# Patient Record
Sex: Female | Born: 1960 | Race: White | Hispanic: No | Marital: Married | Smoking: Never smoker
Health system: Southern US, Community
[De-identification: ages and names within clinical notes are randomized; demographics above are authoritative.]

## PROBLEM LIST (undated history)

## (undated) DIAGNOSIS — K635 Polyp of colon: Secondary | ICD-10-CM

## (undated) DIAGNOSIS — F419 Anxiety disorder, unspecified: Secondary | ICD-10-CM

## (undated) DIAGNOSIS — R51 Headache: Secondary | ICD-10-CM

## (undated) DIAGNOSIS — N390 Urinary tract infection, site not specified: Secondary | ICD-10-CM

## (undated) DIAGNOSIS — IMO0002 Reserved for concepts with insufficient information to code with codable children: Secondary | ICD-10-CM

## (undated) HISTORY — DX: Polyp of colon: K63.5

## (undated) HISTORY — PX: OOPHORECTOMY: SHX86

## (undated) HISTORY — DX: Anxiety disorder, unspecified: F41.9

## (undated) HISTORY — PX: ABDOMINAL HYSTERECTOMY: SHX81

## (undated) HISTORY — DX: Reserved for concepts with insufficient information to code with codable children: IMO0002

## (undated) HISTORY — DX: Headache: R51

## (undated) HISTORY — DX: Urinary tract infection, site not specified: N39.0

---

## 1988-11-29 DIAGNOSIS — IMO0002 Reserved for concepts with insufficient information to code with codable children: Secondary | ICD-10-CM

## 1988-11-29 HISTORY — DX: Reserved for concepts with insufficient information to code with codable children: IMO0002

## 2005-11-29 HISTORY — PX: NASAL SINUS SURGERY: SHX719

## 2005-12-05 LAB — HM MAMMOGRAPHY: HM Mammogram: NORMAL

## 2008-02-13 ENCOUNTER — Encounter: Admission: RE | Admit: 2008-02-13 | Discharge: 2008-02-13 | Payer: Self-pay | Admitting: Family Medicine

## 2008-12-12 ENCOUNTER — Encounter: Admission: RE | Admit: 2008-12-12 | Discharge: 2008-12-12 | Payer: Self-pay | Admitting: Otolaryngology

## 2009-04-21 IMAGING — CT CT ABDOMEN W/O CM
2 of 4 series · 17 of 46 positions shown, 19 images · IV contrast (READICAT/WATER &)
Comparison: none

CLINICAL DATA: Right lower quadrant abdominal pain.  Some nausea.  
 ABDOMEN CT WITHOUT CONTRAST:
TECHNIQUE: Multidetector CT imaging of the abdomen was performed following the standard protocol without IV contrast.  No IV contrast media was given due to a history of prior IV contrast allergy.
TECHNIQUE: Multidetector CT imaging of the pelvis was performed following the standard protocol without IV contrast.

[Series 2: a&p w/o · axial · non-contrast · 0.62mm/px · z∈[-380,-20]mm · 14 of 80 slices shown, 16 images]
[im 4/80  soft-tissue]
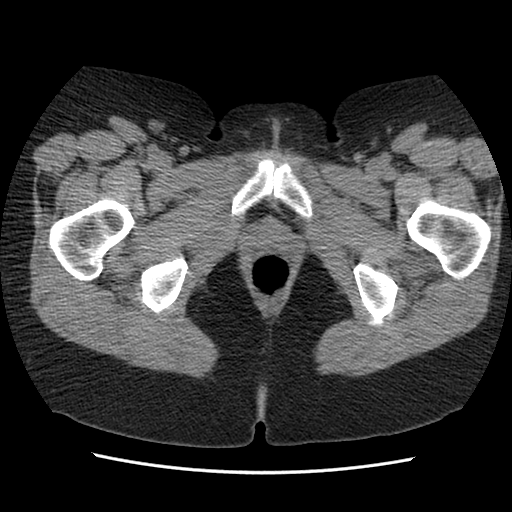
[im 4/80  bone]
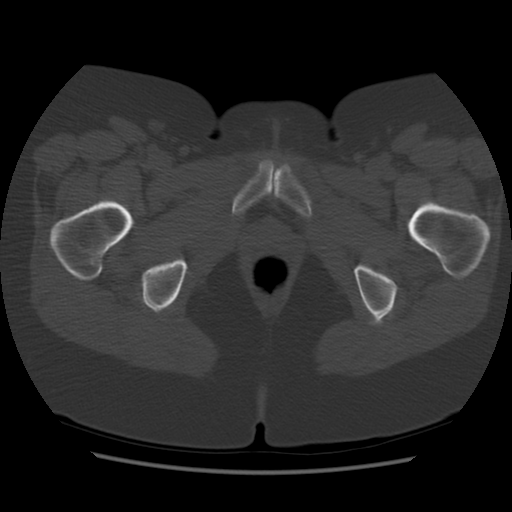
[im 10/80  soft-tissue]
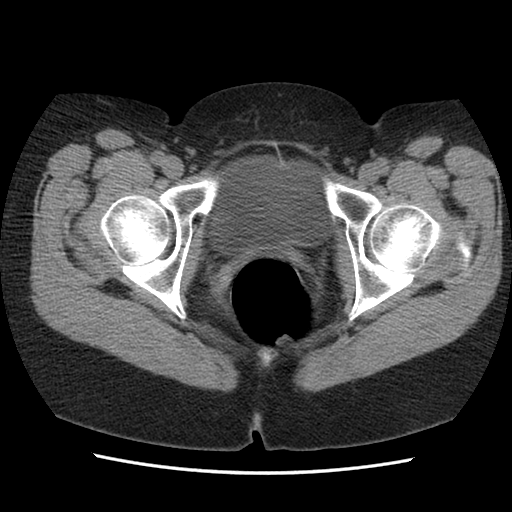
[im 17/80  soft-tissue]
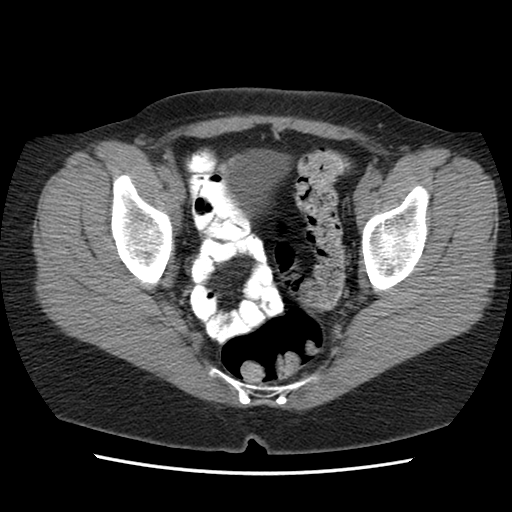
[im 20/80  soft-tissue]
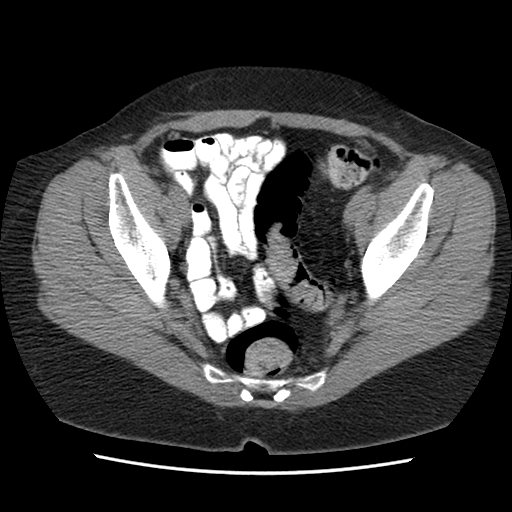
[im 27/80  soft-tissue]
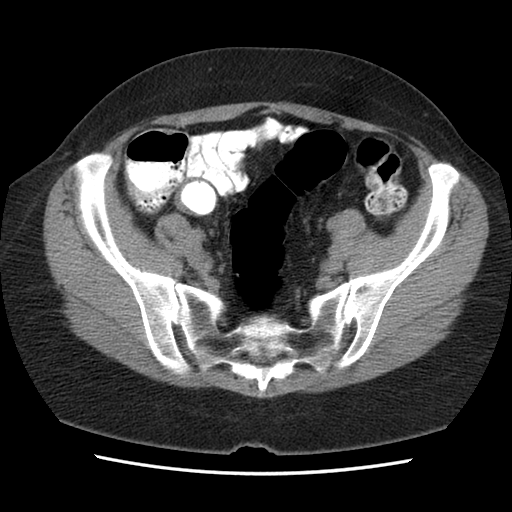
[im 33/80  soft-tissue]
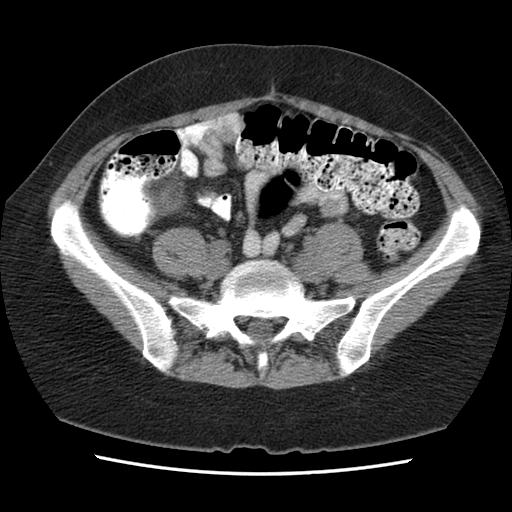
[im 37/80  soft-tissue]
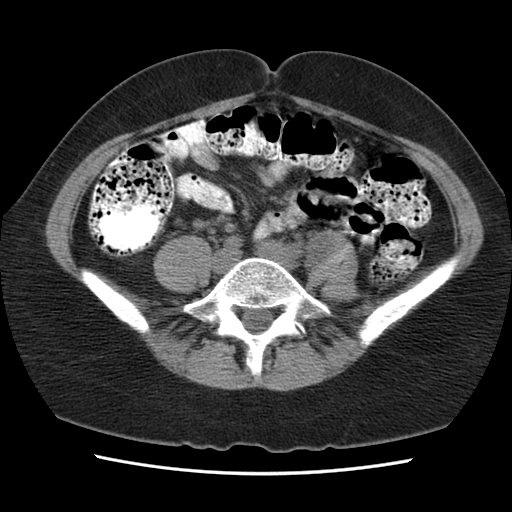
[im 43/80  soft-tissue]
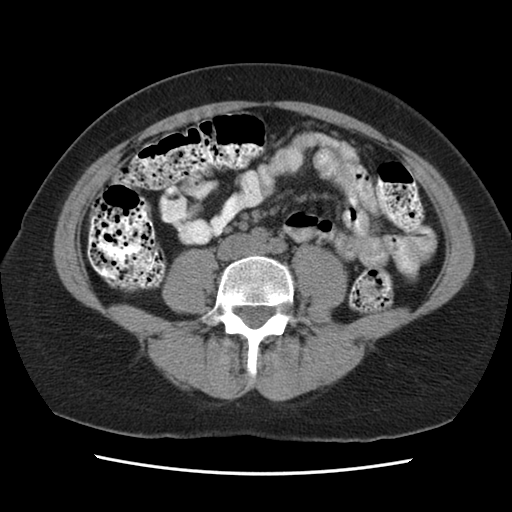
[im 47/80  soft-tissue]
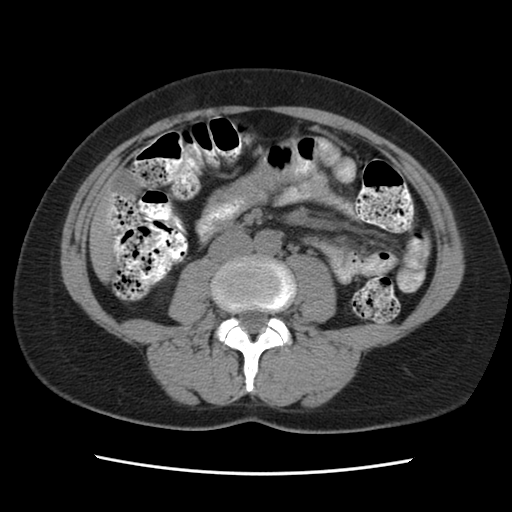
[im 47/80  bone]
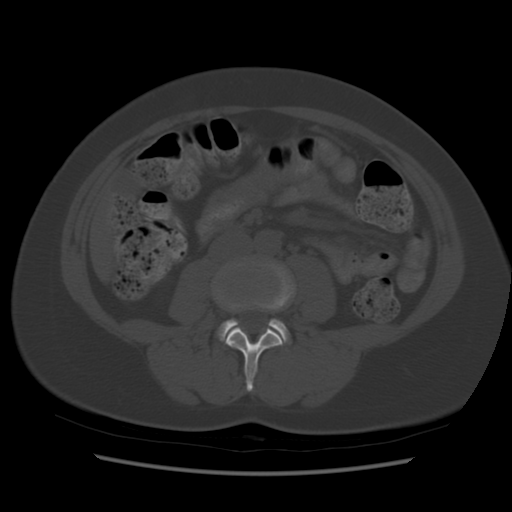
[im 53/80  soft-tissue]
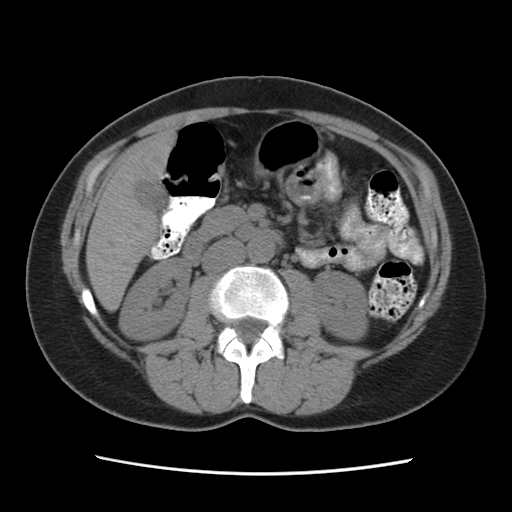
[im 60/80  soft-tissue]
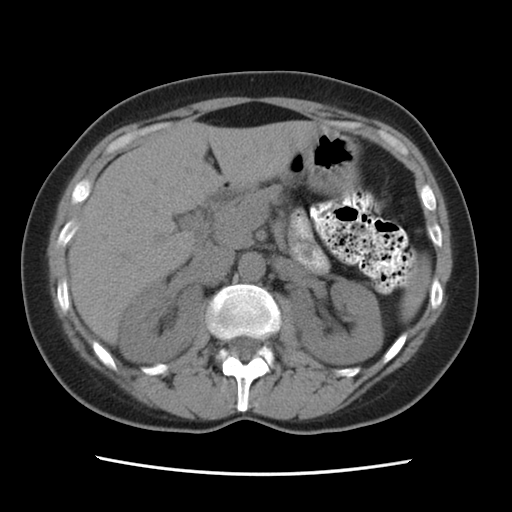
[im 63/80  soft-tissue]
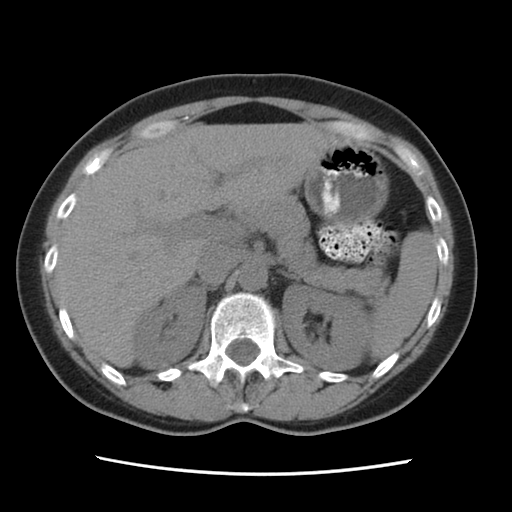
[im 70/80  soft-tissue]
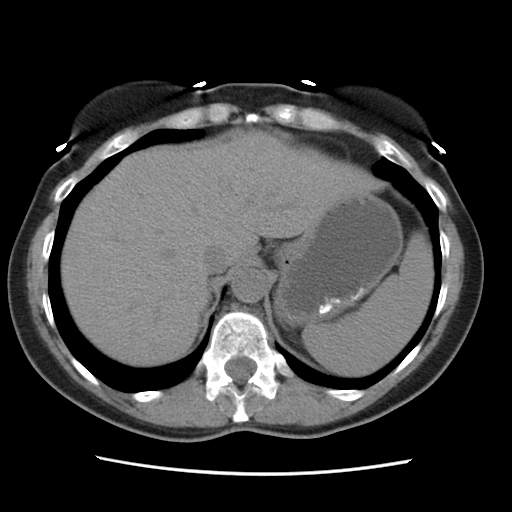
[im 76/80  soft-tissue]
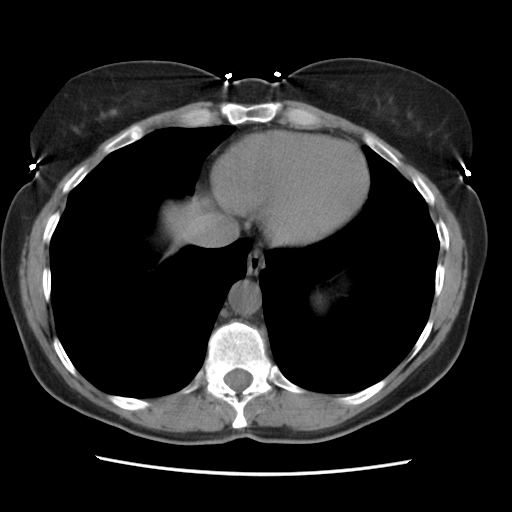

[Series 400: cor abd · coronal · 0.82mm/px · 3 of 81 slices shown]
[im 27/81  soft-tissue]
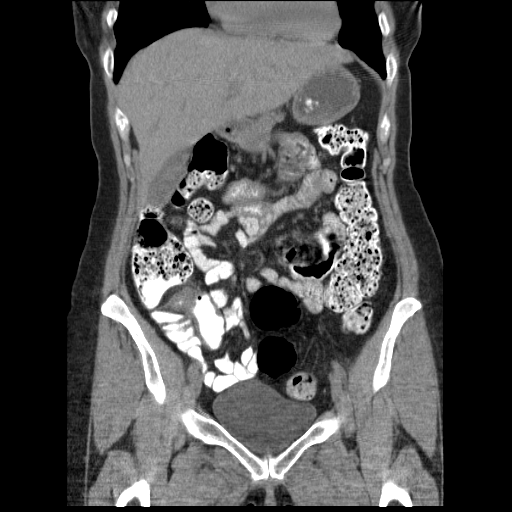
[im 36/81  soft-tissue]
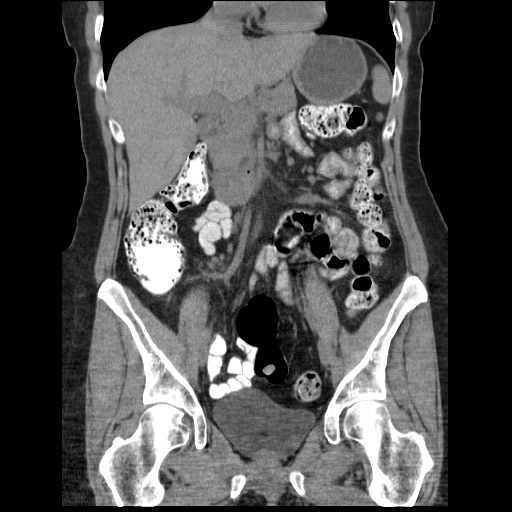
[im 45/81  soft-tissue]
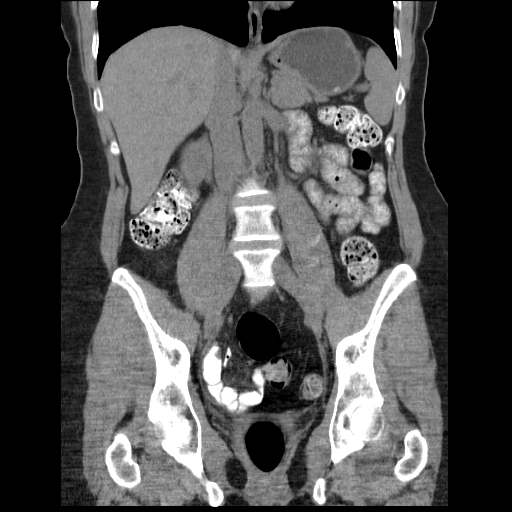

[17 of 46 positions shown; findings below may reference images not displayed]

FINDINGS: The lung bases are clear.  The liver appears normal in the unenhanced state.  No calcified gallstones are seen.  The pancreas is grossly normal on this unenhanced exam.  The adrenal glands and spleen appear normal.  Scanning through the kidneys no renal calculi are seen and there is no evidence of hydronephrosis.  The abdominal aorta is normal in caliber.
IMPRESSION: Negative CT of the abdomen.  No renal calculi and no hydronephrosis. 
 PELVIS CT WITHOUT CONTRAST:
FINDINGS: The distal ureters are normal in caliber.  However, there is thickening of the mucosa of the distal and terminal ileum.  This is suspicious for inflammatory bowel disease.  No abscess is seen.  The urinary bladder is unremarkable.  No fluid is noted within the pelvis and no adenopathy is seen.
IMPRESSION: Mucosal edema of the distal and terminal ileum suspicious for inflammatory bowel disease such as Crohn's.  No abscess.

## 2009-08-22 ENCOUNTER — Ambulatory Visit (HOSPITAL_COMMUNITY): Admission: RE | Admit: 2009-08-22 | Discharge: 2009-08-22 | Payer: Self-pay | Admitting: Urology

## 2010-12-20 ENCOUNTER — Encounter: Payer: Self-pay | Admitting: Family Medicine

## 2011-09-08 ENCOUNTER — Encounter: Payer: Self-pay | Admitting: Family Medicine

## 2011-09-08 ENCOUNTER — Ambulatory Visit (INDEPENDENT_AMBULATORY_CARE_PROVIDER_SITE_OTHER): Payer: Managed Care, Other (non HMO) | Admitting: Family Medicine

## 2011-09-08 VITALS — BP 140/92 | HR 80 | Temp 98.8°F | Resp 12 | Ht 63.75 in | Wt 155.0 lb

## 2011-09-08 DIAGNOSIS — D126 Benign neoplasm of colon, unspecified: Secondary | ICD-10-CM

## 2011-09-08 DIAGNOSIS — Z8659 Personal history of other mental and behavioral disorders: Secondary | ICD-10-CM | POA: Insufficient documentation

## 2011-09-08 DIAGNOSIS — K589 Irritable bowel syndrome without diarrhea: Secondary | ICD-10-CM | POA: Insufficient documentation

## 2011-09-08 DIAGNOSIS — R002 Palpitations: Secondary | ICD-10-CM

## 2011-09-08 DIAGNOSIS — K635 Polyp of colon: Secondary | ICD-10-CM | POA: Insufficient documentation

## 2011-09-08 DIAGNOSIS — G43909 Migraine, unspecified, not intractable, without status migrainosus: Secondary | ICD-10-CM | POA: Insufficient documentation

## 2011-09-08 LAB — BASIC METABOLIC PANEL
Calcium: 8.8 mg/dL (ref 8.4–10.5)
GFR: 103.67 mL/min (ref >60.00)
Glucose, Bld: 117 mg/dL — ABNORMAL HIGH (ref 70–99)
Sodium: 141 mEq/L (ref 135–145)

## 2011-09-08 NOTE — Patient Instructions (Signed)
Continue to minimize caffeine. Consider low dose beta blocker if symptoms persist.

## 2011-09-08 NOTE — Progress Notes (Signed)
  Subjective:    Patient ID: Debra Mcgrath, female    DOB: 09/27/1961, 50 y.o.   MRN: [id number]  HPI New patient evaluation. Patient has history of childhood asthma, remote history depression, history of migraine headaches, colon polyps and recurrent UTIs. Previous hysterectomy for benign disease. She gives history of some type of possible ectopic thyroid tissue on one of her ovaries several years ago. At that time presented with palpitations and tachycardia.  She relates two-week history of some frequent palpitations and sensation of heart racing. Pulse not taken. Minimal caffeine use. No decongestant use. Currently takes only Keflex post coital to reduce UTI risk. Occasional dizziness. No syncope. Denies chest pains or dyspnea. Weight stable. No recent diarrhea. No tremors. Denies specific stressors.  Family history and social history reviewed as below.  Past Medical History  Diagnosis Date  . Asthma   . Depression   . Headache   . Migraine   . Colon polyps   . UTI (urinary tract infection)   . 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 Nasal sinus surgery 2007  . Pelvic floor repair     reports that she has never smoked. She does not have any smokeless tobacco history on file. Her alcohol and drug histories not on file. family history includes Cancer in her father and paternal aunt; Hypertension in her maternal grandfather; and Stroke in her maternal grandfather. No Known Allergies    Review of Systems  Constitutional: Negative for fever, appetite change, fatigue and unexpected weight change.  Respiratory: Negative for cough, shortness of breath and wheezing.   Cardiovascular: Positive for palpitations. Negative for chest pain and leg swelling.  Gastrointestinal: Negative for nausea, vomiting, abdominal pain and diarrhea.  Genitourinary: Negative for dysuria.  Musculoskeletal: Negative for back pain.  Skin:  Negative for rash.  Neurological: Positive for dizziness. Negative for syncope and weakness.  Psychiatric/Behavioral: Negative for dysphoric mood and agitation.       Objective:   Physical Exam  Constitutional: She is oriented to person, place, and time. She appears well-developed and well-nourished.  HENT:  Right Ear: External ear normal.  Left Ear: External ear normal.  Mouth/Throat: Oropharynx is clear and moist.  Neck: Neck supple. No thyromegaly present.  Cardiovascular: Normal rate, regular rhythm and normal heart sounds.   No murmur heard. Pulmonary/Chest: Effort normal and breath sounds normal. No respiratory distress. She has no wheezes. She has no rales.  Musculoskeletal: She exhibits no edema.  Lymphadenopathy:    She has no cervical adenopathy.  Neurological: She is alert and oriented to person, place, and time.  Psychiatric: She has a normal mood and affect. Her behavior is normal.          Assessment & Plan:  #1 palpitations. Normal rate and rhythm at this time. Baseline EKG. Check thyroid functions. Previous Holter monitor has been unremarkable. Consider possible repeat Holter monitoring verses low dose beta blocker pending labs. #2 History of IBS #3 History of depression stable. #4 hx of migraine headaches.  EKG shows sinus rhythm with no acute findings. Heart rate around 70.

## 2011-09-09 NOTE — Progress Notes (Signed)
Quick Note:  Pt informed on home VM, will mail a copy to home ______

## 2011-09-21 ENCOUNTER — Ambulatory Visit (INDEPENDENT_AMBULATORY_CARE_PROVIDER_SITE_OTHER): Payer: Managed Care, Other (non HMO) | Admitting: Family Medicine

## 2011-09-21 ENCOUNTER — Encounter: Payer: Self-pay | Admitting: Family Medicine

## 2011-09-21 VITALS — BP 140/90 | Temp 97.6°F | Wt 151.0 lb

## 2011-09-21 DIAGNOSIS — R109 Unspecified abdominal pain: Secondary | ICD-10-CM

## 2011-09-21 DIAGNOSIS — R1030 Lower abdominal pain, unspecified: Secondary | ICD-10-CM

## 2011-09-21 DIAGNOSIS — N949 Unspecified condition associated with female genital organs and menstrual cycle: Secondary | ICD-10-CM

## 2011-09-21 DIAGNOSIS — IMO0002 Reserved for concepts with insufficient information to code with codable children: Secondary | ICD-10-CM

## 2011-09-21 DIAGNOSIS — R102 Pelvic and perineal pain: Secondary | ICD-10-CM

## 2011-09-21 LAB — CBC WITH DIFFERENTIAL/PLATELET
Basophils Absolute: 0 10*3/uL (ref 0.0–0.1)
Basophils Relative: 0.4 % (ref 0.0–3.0)
Eosinophils Absolute: 0 10*3/uL (ref 0.0–0.7)
Lymphocytes Relative: 30 % (ref 12.0–46.0)
MCHC: 33.7 g/dL (ref 30.0–36.0)
Neutrophils Relative %: 63 % (ref 43.0–77.0)
RBC: 4.55 Mil/uL (ref 3.87–5.11)
RDW: 12.9 % (ref 11.5–14.6)

## 2011-09-21 LAB — SEDIMENTATION RATE: Sed Rate: 23 mm/hr — ABNORMAL HIGH (ref 0–22)

## 2011-09-21 NOTE — Patient Instructions (Signed)
We will call you regarding GYN appointment.

## 2011-09-21 NOTE — Progress Notes (Signed)
  Subjective:    Patient ID: Debra Mcgrath, female    DOB: Dec 16, 1960, 50 y.o.   MRN: [id number]  HPI  Patient seen with several weeks if not months of poorly localized pain in the pelvic region. She complains of sharp pain which is relatively constant right pelvic region and also some pain intra-vaginal. Previous total abdominal hysterectomy several years ago. She has noted pain with intercourse which seems to be along the posterior wall of the vagina. She's had recent symptoms of nausea and fatigue but reports weight gain- although she is down 4 pounds from last visit.  She has seen urologist previously for hematuria workup which was unrevealing. She reportedly had some type of cyst on her kidney. She just saw urologist couple weeks ago reportedly urinalysis unremarkable then. Patient relates no change in bowel movements. She has questionable history of IBS but no recent constipation or diarrhea. Colonoscopy 2006 reportedly showed benign polyps. Questionable family history of Crohn's disease in her son. Patient had CT abdomen and pelvis 2009 with comment of questionable inflammation ileum consistent with Crohn's. She does not recall any information regarding that.  Patient has questionable history of endometriosis.  Not sure how this was confirmed.  Past Medical History  Diagnosis Date  . Asthma   . Depression   . Headache   . Migraine   . Colon polyps   . UTI (urinary tract infection)   . 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 Nasal sinus surgery 2007  . Pelvic floor repair     reports that she has never smoked. She does not have any smokeless tobacco history on file. Her alcohol and drug histories not on file. family history includes Cancer in her father and paternal aunt; Hypertension in her maternal grandfather; and Stroke in her maternal grandfather. No Known Allergies   Review of Systems  Constitutional:  Positive for appetite change. Negative for fever and chills.  Respiratory: Negative for cough and shortness of breath.   Cardiovascular: Negative for chest pain.  Gastrointestinal: Positive for nausea and abdominal pain. Negative for vomiting, diarrhea, constipation, blood in stool and abdominal distention.  Genitourinary: Positive for vaginal pain and pelvic pain. Negative for dysuria, vaginal bleeding and vaginal discharge.  Hematological: Negative for adenopathy.       Objective:   Physical Exam  Constitutional: She appears well-developed and well-nourished.  Cardiovascular: Normal rate, regular rhythm and normal heart sounds.   Pulmonary/Chest: Effort normal and breath sounds normal. No respiratory distress. She has no wheezes. She has no rales.  Abdominal: Soft. Bowel sounds are normal. She exhibits no distension and no mass. There is no rebound and no guarding.       Mildly tender to palpation right and left lower quadrants  Genitourinary:       Normal external genitalia. Patient has absence of cervix and uterus. She has scar tissue vaginal cuff from previous hysterectomy. Along the right posterior wall of the vagina she has very inflamed erythematous tissue. No distinct mass. No vaginal discharge.          Assessment & Plan:  Pelvic pain and lower abdominal pain. Question etiology. Check CBC and sed rate. GYN referral given abnormal exam above-she has inflamed appearing tissue vaginal cuff region R side > L of uncertain significance.  No vaginal discharge.. Consider GI referral if pain persist for consideration of repeat colonoscopy

## 2011-09-22 ENCOUNTER — Telehealth: Payer: Self-pay | Admitting: Family Medicine

## 2011-09-22 NOTE — Progress Notes (Signed)
Quick Note:  Pt informed ______ 

## 2011-09-22 NOTE — Telephone Encounter (Signed)
Please advise 

## 2011-09-22 NOTE — Telephone Encounter (Signed)
[name], your advise please

## 2011-09-22 NOTE — Telephone Encounter (Signed)
Pt called and said that the gynecologist, Dr Marcelle Overlie, next avail appt is not until 10/12/11. Pt says that she can not wait that long and is req another recommendation for gynecologist.

## 2011-09-22 NOTE — Telephone Encounter (Signed)
Let's see if someone else in their group can see her sooner.

## 2011-10-06 LAB — HM PAP SMEAR: HM Pap smear: ABNORMAL

## 2011-11-15 ENCOUNTER — Encounter: Payer: Self-pay | Admitting: Family Medicine

## 2011-11-15 ENCOUNTER — Ambulatory Visit (INDEPENDENT_AMBULATORY_CARE_PROVIDER_SITE_OTHER): Payer: Managed Care, Other (non HMO) | Admitting: Family Medicine

## 2011-11-15 VITALS — BP 138/82 | Temp 98.4°F | Wt 150.0 lb

## 2011-11-15 DIAGNOSIS — J029 Acute pharyngitis, unspecified: Secondary | ICD-10-CM

## 2011-11-15 DIAGNOSIS — J329 Chronic sinusitis, unspecified: Secondary | ICD-10-CM

## 2011-11-15 MED ORDER — AMOXICILLIN-POT CLAVULANATE 875-125 MG PO TABS: 1.0000 | ORAL_TABLET | Freq: Two times a day (BID) | ORAL | Status: AC

## 2011-11-15 NOTE — Progress Notes (Signed)
  Subjective:    Patient ID: Debra Mcgrath, female    DOB: Jun 14, 1961, 50 y.o.   MRN: [id number]  HPI  Patient seen with acute illness. She's had about 3 weeks of some intermittent facial fullness and sinus congestion maxillary region bilaterally. Past few days had some sore throat and body aches. No confirmed fever. Sensation of fullness in both ears. Very transient vertigo. No hearing loss. Both son and daughter recently diagnosed with group A strep. Patient has prior history of sinus surgery 2007. Nonsmoker. Minimal cough, nonproductive.   Review of Systems  Constitutional: Positive for fatigue. Negative for fever and chills.  HENT: Positive for congestion, sore throat and sinus pressure.   Respiratory: Positive for cough.   Neurological: Negative for headaches.       Objective:   Physical Exam  Constitutional: She appears well-developed and well-nourished. No distress.  HENT:  Right Ear: External ear normal.  Left Ear: External ear normal.  Mouth/Throat: Oropharynx is clear and moist.  Neck: Neck supple.  Cardiovascular: Normal rate and regular rhythm.   No murmur heard. Pulmonary/Chest: Effort normal and breath sounds normal. No respiratory distress. She has no wheezes. She has no rales.  Lymphadenopathy:    She has no cervical adenopathy.          Assessment & Plan:  Acute sinusitis. Rapid strep negative. Augmentin 875 mg twice daily for 10 days

## 2012-01-06 ENCOUNTER — Encounter: Payer: Self-pay | Admitting: Family Medicine

## 2012-01-06 ENCOUNTER — Ambulatory Visit (INDEPENDENT_AMBULATORY_CARE_PROVIDER_SITE_OTHER): Payer: Managed Care, Other (non HMO) | Admitting: Family Medicine

## 2012-01-06 VITALS — BP 142/88 | Temp 97.8°F | Wt 152.0 lb

## 2012-01-06 DIAGNOSIS — M549 Dorsalgia, unspecified: Secondary | ICD-10-CM

## 2012-01-06 DIAGNOSIS — L988 Other specified disorders of the skin and subcutaneous tissue: Secondary | ICD-10-CM

## 2012-01-06 DIAGNOSIS — R238 Other skin changes: Secondary | ICD-10-CM

## 2012-01-06 MED ORDER — CYCLOBENZAPRINE HCL 5 MG PO TABS: 5.0000 mg | ORAL_TABLET | Freq: Three times a day (TID) | ORAL | Status: AC | PRN

## 2012-01-06 NOTE — Patient Instructions (Signed)
Be in touch for any weakness, progressive dizziness, or if back symptoms not improving over the next couple of weeks.

## 2012-01-06 NOTE — Progress Notes (Signed)
  Subjective:    Patient ID: Debra Mcgrath, female    DOB: [birth year], 51 y.o.   MRN: [id number]  HPI  Patient seen for several issues as follows  About 2-3 weeks ago possible bite posterior to right ear. She did not see any kind of insect or spider. Noticed erythematous papules several days ago. No pustule. Reducing in size since then. Minimally tender. No fever or chills.  Patient has back pain very poorly localized past 3 weeks. Pain is somewhat bilateral lumbar but also some upper back pain as well. Poor sleep quality. Diffuse soreness. Intermittent muscle spasm. No known injury. Occasional tingling sensation in both feet but no clear weakness. No incontinence. No fever or chills. No appetite or weight change.  No dysuria. No change in bowel habits.  Past Medical History  Diagnosis Date  . Asthma   . Depression   . Headache   . Migraine   . Colon polyps   . UTI (urinary tract infection)   . 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 Nasal sinus surgery 2007  . Pelvic floor repair     reports that she has never smoked. She does not have any smokeless tobacco history on file. Her alcohol and drug histories not on file. family history includes Cancer in her father and paternal aunt; Hypertension in her maternal grandfather; and Stroke in her maternal grandfather. No Known Allergies    Review of Systems  Constitutional: Negative for fever, chills, appetite change and unexpected weight change.  Respiratory: Negative for cough and shortness of breath.   Cardiovascular: Negative for chest pain.  Gastrointestinal: Negative for nausea, vomiting, abdominal pain, diarrhea and constipation.  Genitourinary: Negative for dysuria and hematuria.  Musculoskeletal: Positive for back pain.  Neurological: Positive for numbness. Negative for dizziness and weakness.  Hematological: Negative for adenopathy.       Objective:   Physical Exam  Constitutional: She appears well-developed and well-nourished.  HENT:  Mouth/Throat: Oropharynx is clear and moist.  Neck: Neck supple. No thyromegaly present.  Cardiovascular: Normal rate and regular rhythm.   No murmur heard. Pulmonary/Chest: Effort normal and breath sounds normal. No respiratory distress. She has no wheezes. She has no rales.  Musculoskeletal: She exhibits no edema.       Straight leg raise is negative. Patient has some nonspecific tenderness musculature her lower lumbar and upper thoracic regions somewhat bilaterally. Also has some trapezius tenderness bilaterally. Full range of motion neck  Lymphadenopathy:    She has no cervical adenopathy.  Neurological:       Full strength lower extremities. Symmetric knee and ankle reflexes  Skin:       Nonspecific small 4 mm erythematous papule posterior to right ear. No pustule. Nonfluctuant.          Assessment & Plan:  #1 poorly localized cervical and lumbar back pain. Question muscular. Exacerbated by poor sleep. Nonfocal neurologic exam. Trial of low-dose cyclobenzaprine 5 mg each bedtime. Continue walking and try moist heat. #2 nonspecific erythematous papule right posterior ear. No evidence for abscess. Reassurance and observe

## 2012-01-20 ENCOUNTER — Encounter: Payer: Self-pay | Admitting: Cardiovascular Disease

## 2012-01-21 ENCOUNTER — Encounter: Payer: Self-pay | Admitting: Cardiovascular Disease

## 2012-01-25 ENCOUNTER — Ambulatory Visit (INDEPENDENT_AMBULATORY_CARE_PROVIDER_SITE_OTHER): Payer: Managed Care, Other (non HMO) | Admitting: Cardiovascular Disease

## 2012-01-25 ENCOUNTER — Encounter: Payer: Self-pay | Admitting: Cardiovascular Disease

## 2012-01-25 DIAGNOSIS — R079 Chest pain, unspecified: Secondary | ICD-10-CM | POA: Insufficient documentation

## 2012-01-25 NOTE — Assessment & Plan Note (Signed)
Atypical chest pain that lasted for 24 hours. No risk factors for CAD. I do not think ischemic testing is indicated. Her EKG is normal today. She also describes dyspnea so will arrange echo to assess LV function. If she has any recurrence of palpitations, she will call in and we can arrange a 48 hour monitor at that time.

## 2012-01-25 NOTE — Patient Instructions (Signed)
Your physician recommends that you schedule a follow-up appointment in: 3-4 weeks  Your physician has requested that you have an echocardiogram. Echocardiography is a painless test that uses sound waves to create images of your heart. It provides your doctor with information about the size and shape of your heart and how well your heart's chambers and valves are working. This procedure takes approximately one hour. There are no restrictions for this procedure.    

## 2012-01-25 NOTE — Progress Notes (Signed)
History of Present Illness: 51 yo WF with history of childhood asthma, anxiety, migraines who is here today as a new patient for evaluation of chest pain and SOB. She tells me that 9 years ago in [place] she had chest pain and SOB and was watched on telemetry for 48 hours and was told her heart rhythm was irregular. She wore a Holter several times over the next few years with no problems noted. She did well until the last year when she began to feel very tired. She started HRT but stopped. This made her nauseous. Last October, she began to feel her heart skipping, dizziness. She was bitten by an insect in [place] but had negative testing for Lyme Disease. Her husband had an MI 01/13/12. Last week she had an episode of chest pain associated with dyspnea. The pain radiated to her jaw. The chest pain was central and lasted for a full day. This is described as a heaviness and she had a hard time taking a deep breath. She has had no recurrence of chest pain or SOB over the last 48 hours. No awareness of palpitations over last 5 day.   Primary Care Physician: Angelene Giovanni, PA-C.   Past Medical History  Diagnosis Date  . Asthma     Childhood  . Headache   . Migraine   . Colon polyps   . UTI (urinary tract infection)   . 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 Anxiety     Past Surgical History  Procedure Date  . Nasal sinus surgery 2007  . Abdominal hysterectomy   . Oophorectomy     Current Outpatient Prescriptions  Medication Sig Dispense Refill  . ALPRAZolam (XANAX) 0.25 MG tablet Prescribed but pt has never used it      . aspirin EC 81 MG tablet Take 81 mg by mouth daily.      . Calcium-Magnesium-Vitamin D (CITRACAL CALCIUM+D PO) Take by mouth. Take  Two tabs daily      . cephALEXin (KEFLEX) 500 MG capsule Standing order for pt when she has UTI      . cyclobenzaprine (FLEXERIL) 5 MG tablet Pt has med prescribed but has not used any so far      . Flaxseed,  Linseed, (FLAX SEED OIL PO) Take by mouth. Take 1 tab daily      . Multiple Vitamin (MULTIVITAMIN) tablet Take 1 tablet by mouth daily. Gummy Vitamin      . NON FORMULARY Compounded estrogen vaginal cream 2-3 times a week Dr Jennette Kettle        No Known Allergies  History   Social History  . Marital Status: Married    Spouse Name: N/A    Number of Children: 3  . Years of Education: N/A   Occupational History  . [employer]   Social History Main Topics  . Smoking status: Never Smoker   . Smokeless tobacco: Not on file  . Alcohol Use: No  . Drug Use: No  . Sexually Active: Not on file   Other Topics Concern  . Not on file   Social History Narrative  . No narrative on file    Family History  Problem Relation Age of Onset  . Cancer Father 43    prostate  . Cancer Paternal Aunt     breast  . Stroke Maternal Grandfather   . Hypertension Maternal Grandfather     Review of Systems:  As stated in the  HPI and otherwise negative.   BP 159/92  Pulse 67  Ht 5\' 5"  (1.651 m)  Wt 149 lb (67.586 kg)  BMI 24.79 kg/m2  Physical Examination: General: Well developed, well nourished, NAD HEENT: OP clear, mucus membranes moist SKIN: warm, dry. No rashes. Neuro: No focal deficits Musculoskeletal: Muscle strength 5/5 all ext Psychiatric: Mood and affect normal Neck: No JVD, no carotid bruits, no thyromegaly, no lymphadenopathy. Lungs:Clear bilaterally, no wheezes, rhonci, crackles Cardiovascular: Regular rate and rhythm. No murmurs, gallops or rubs. Abdomen:Soft. Bowel sounds present. Non-tender.  Extremities: No lower extremity edema. Pulses are 2 + in the bilateral DP/PT.  EKG: NSR, rate 67 bpm.

## 2012-02-07 ENCOUNTER — Other Ambulatory Visit: Payer: Self-pay

## 2012-02-07 ENCOUNTER — Ambulatory Visit (HOSPITAL_COMMUNITY): Payer: Managed Care, Other (non HMO) | Attending: Cardiology

## 2012-02-07 DIAGNOSIS — I359 Nonrheumatic aortic valve disorder, unspecified: Secondary | ICD-10-CM | POA: Insufficient documentation

## 2012-02-07 DIAGNOSIS — R072 Precordial pain: Secondary | ICD-10-CM | POA: Insufficient documentation

## 2012-02-07 DIAGNOSIS — R0609 Other forms of dyspnea: Secondary | ICD-10-CM | POA: Insufficient documentation

## 2012-02-07 DIAGNOSIS — R0989 Other specified symptoms and signs involving the circulatory and respiratory systems: Secondary | ICD-10-CM | POA: Insufficient documentation

## 2012-02-07 DIAGNOSIS — I079 Rheumatic tricuspid valve disease, unspecified: Secondary | ICD-10-CM | POA: Insufficient documentation

## 2012-02-11 ENCOUNTER — Telehealth: Payer: Self-pay | Admitting: Cardiovascular Disease

## 2012-02-11 NOTE — Telephone Encounter (Signed)
New msg Pt wants to know if stress test results are back yet

## 2012-02-11 NOTE — Telephone Encounter (Signed)
Spoke with pt and gave her preliminary echo report and told her we would call her back if Dr. Clifton James had any additional recommendations after reviewing.

## 2012-02-21 ENCOUNTER — Ambulatory Visit (INDEPENDENT_AMBULATORY_CARE_PROVIDER_SITE_OTHER): Payer: Managed Care, Other (non HMO) | Admitting: Cardiovascular Disease

## 2012-02-21 ENCOUNTER — Encounter: Payer: Self-pay | Admitting: Cardiovascular Disease

## 2012-02-21 DIAGNOSIS — R079 Chest pain, unspecified: Secondary | ICD-10-CM

## 2012-02-21 NOTE — Progress Notes (Signed)
History of Present Illness: 51 yo WF with history of childhood asthma, anxiety, migraines who is here today for cardiac follow up. I saw her four weeks ago as a new patient for evaluation of chest pain and SOB. She told me that 9 years ago in [place] she had chest pain and SOB and was watched on telemetry for 48 hours and was told her heart rhythm was irregular. She wore a Holter several times over the next few years with no problems noted. She did well until the last year when she began to feel very tired. She started HRT but stopped. This made her nauseous. Last October, she began to feel her heart skipping, dizziness. She was bitten by an insect in [place] but had negative testing for Lyme Disease. Her husband had an MI 01/13/12. Last week she had an episode of chest pain associated with dyspnea. The pain radiated to her jaw. The chest pain was central and lasted for a full day. This is described as a heaviness and she had a hard time taking a deep breath. I ordered an echo which showed normal LV size and function with no significant valvular issues. I did not think ischemic testing was indicated at that time. Today she describes chest pressure with exertion and at rest. This has no associated diaphoresis, N/V but there is associated SOB. No near syncope or syncope.   Primary Care Physician: Debra Giovanni, PA-C  Past Medical History  Diagnosis Date  . Asthma     Childhood  . Headache   . Migraine   . Colon polyps   . UTI (urinary tract infection)   . 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 Anxiety     Past Surgical History  Procedure Date  . Nasal sinus surgery 2007  . Abdominal hysterectomy   . Oophorectomy     Current Outpatient Prescriptions  Medication Sig Dispense Refill  . ALPRAZolam (XANAX) 0.25 MG tablet Prescribed but pt has never used it      . aspirin EC 81 MG tablet Take 81 mg by mouth daily.      . Calcium-Magnesium-Vitamin D (CITRACAL  CALCIUM+D PO) Take by mouth. Take  Two tabs daily      . cephALEXin (KEFLEX) 500 MG capsule Standing order for pt when she has UTI      . cyclobenzaprine (FLEXERIL) 5 MG tablet Pt has med prescribed but has not used any so far      . Flaxseed, Linseed, (FLAX SEED OIL PO) Take by mouth. Take 1 tab daily      . Multiple Vitamin (MULTIVITAMIN) tablet Take 1 tablet by mouth daily. Gummy Vitamin      . NON FORMULARY Compounded estrogen vaginal cream 2-3 times a week Dr Jennette Kettle        No Known Allergies  History   Social History  . Marital Status: Married    Spouse Name: N/A    Number of Children: 3  . Years of Education: N/A   Occupational History  . [employer]   Social History Main Topics  . Smoking status: Never Smoker   . Smokeless tobacco: Not on file  . Alcohol Use: No  . Drug Use: No  . Sexually Active: Not on file   Other Topics Concern  . Not on file   Social History Narrative  . No narrative on file    Family History  Problem Relation Age of Onset  . Cancer  Father 90    prostate  . Cancer Paternal Aunt     breast  . Stroke Maternal Grandfather   . Hypertension Maternal Grandfather     Review of Systems:  As stated in the HPI and otherwise negative.   BP 158/88  Pulse 68  Ht 5' 4.5" (1.638 m)  Wt 148 lb 6.4 oz (67.314 kg)  BMI 25.08 kg/m2  Physical Examination: General: Well developed, well nourished, NAD HEENT: OP clear, mucus membranes moist SKIN: warm, dry. No rashes. Neuro: No focal deficits Musculoskeletal: Muscle strength 5/5 all ext Psychiatric: Mood and affect normal Neck: No JVD, no carotid bruits, no thyromegaly, no lymphadenopathy. Lungs:Clear bilaterally, no wheezes, rhonci, crackles Cardiovascular: Regular rate and rhythm. No murmurs, gallops or rubs. Abdomen:Soft. Bowel sounds present. Non-tender.  Extremities: No lower extremity edema. Pulses are 2 + in the bilateral DP/PT.  Echo 02/07/12: Left ventricle: The cavity  size was normal. Wall thickness was normal. Systolic function was normal. The estimated ejection fraction was in the range of 60% to 65%. Wall motion was normal; there were no regional wall motion abnormalities. Left ventricular diastolic function parameters were normal. - Aortic valve: Trivial regurgitation. - Atrial septum: No defect or patent foramen ovale was identified

## 2012-02-21 NOTE — Assessment & Plan Note (Addendum)
Atypical. Will arrange treadmill stress test to exclude ischemia. Echo normal.

## 2012-02-21 NOTE — Patient Instructions (Signed)
Your physician wants you to follow-up in:  12 months. You will receive a reminder letter in the mail two months in advance. If you don't receive a letter, please call our office to schedule the follow-up appointment.  Your physician has requested that you have an exercise tolerance test. For further information please visit [email or web address]. Please also follow instruction sheet, as given.    

## 2012-03-13 ENCOUNTER — Ambulatory Visit (INDEPENDENT_AMBULATORY_CARE_PROVIDER_SITE_OTHER): Payer: Managed Care, Other (non HMO) | Admitting: Nurse Practitioner

## 2012-03-13 DIAGNOSIS — R079 Chest pain, unspecified: Secondary | ICD-10-CM

## 2012-03-13 NOTE — Progress Notes (Signed)
Exercise Treadmill Test  Pre-Exercise Testing Evaluation Rhythm: normal sinus  Rate: 77   PR:  .14 QRS:  .08  QT:  .38 QTc: .43     Test  Exercise Tolerance Test Ordering MD: Melene Muller, MD  Interpreting MD:  Ward Givens NP  Unique Test No: 1  Treadmill:  1  Indication for ETT: chest pain - rule out ischemia  Contraindication to ETT: No   Stress Modality: exercise - treadmill  Cardiac Imaging Performed: non   Protocol: standard Bruce - maximal  Max BP: 182/80  Max MPHR (bpm):  168 85% MPR (bpm):  142  MPHR obtained (bpm):  179 % MPHR obtained:  105%  Reached 85% MPHR (min:sec):5:00 Total Exercise Time (min-sec):  9:00  Workload in METS:  10.1 Borg Scale: 14  Reason ETT Terminated:  desired heart rate attained    ST Segment Analysis At Rest: normal ST segments - no evidence of significant ST depression With Exercise: no evidence of significant ST depression  Other Information Arrhythmia:  No Angina during ETT:  absent (0) Quality of ETT:  diagnostic  ETT Interpretation:  normal - no evidence of ischemia by ST analysis  Comments: Good exercise tolerance.  No chest pain.  Recommendations: F/U with Dr. Clifton James.

## 2014-04-19 ENCOUNTER — Encounter: Payer: Self-pay | Admitting: Cardiovascular Disease

## 2014-05-01 ENCOUNTER — Telehealth: Payer: Self-pay | Admitting: Cardiovascular Disease

## 2014-05-01 NOTE — Telephone Encounter (Signed)
New problem   Pt want to know if labs are need before her appt on 06/07/14 , is so there aren't any orders in the system.

## 2014-05-01 NOTE — Telephone Encounter (Signed)
Pt contacting to see if she needs lab work prior to her upcoming OV in 7/10 with Dr Angelena Form. Went back and reviewed last OV note assessment and plan with McAlhany, and no orders endorsed for lab check before f/u OV noted.  Informed pt of this.  Pt verbalized understanding and very grateful for the f/u.

## 2014-05-16 ENCOUNTER — Ambulatory Visit: Payer: Managed Care, Other (non HMO) | Admitting: Cardiovascular Disease

## 2014-07-04 ENCOUNTER — Ambulatory Visit (INDEPENDENT_AMBULATORY_CARE_PROVIDER_SITE_OTHER): Payer: Managed Care, Other (non HMO) | Admitting: Cardiovascular Disease

## 2014-07-04 ENCOUNTER — Encounter: Payer: Self-pay | Admitting: Cardiovascular Disease

## 2014-07-04 VITALS — BP 140/88 | HR 71 | Ht 64.0 in | Wt 156.0 lb

## 2014-07-04 DIAGNOSIS — R0789 Other chest pain: Secondary | ICD-10-CM

## 2014-07-04 NOTE — Patient Instructions (Signed)
Your physician recommends that you schedule a follow-up appointment as needed with Dr. McAlhany   

## 2014-07-04 NOTE — Progress Notes (Signed)
History of Present Illness: 53 yo WF with history of childhood asthma, anxiety, migraines who is here today for cardiac follow up. I saw her February 2013 as a new patient for evaluation of chest pain and SOB. She told me that in 2004 in [place] she had chest pain and SOB and was watched on telemetry for 48 hours and was told her heart rhythm was irregular. She wore a Holter several times over the next few years with no problems noted. She did well until 2012 when she began to feel very tired. She started HRT but stopped. This made her nauseous. October 2012 she began to feel her heart skipping, dizziness. At her first visit here she described chest pain. I ordered an echo which showed normal LV size and function with no significant valvular issues. Exercise treadmill stress test 03/13/12 with no ischemia, good exercise tolerance.   She is here today for follow up. No chest pain or SOB. No palpitations. No lower ext edema.   Primary Care Physician: Charlies Silvers, PA-C  Past Medical History  Diagnosis Date  . Asthma     Childhood  . Headache(784.0)   . Migraine   . Colon polyps   . UTI (urinary tract infection)   . 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 Anxiety     Past Surgical History  Procedure Laterality Date  . Nasal sinus surgery  2007  . Abdominal hysterectomy    . Oophorectomy      Current Outpatient Prescriptions  Medication Sig Dispense Refill  . ALPRAZolam (XANAX) 0.25 MG tablet Prescribed but pt has never used it      . Ascorbic Acid (VITAMIN C) 1000 MG tablet Take 1,000 mg by mouth daily.      Marland Kitchen aspirin EC 81 MG tablet Take 81 mg by mouth daily.      . Calcium-Magnesium-Vitamin D (CITRACAL CALCIUM+D PO) Take by mouth. Take  Two tabs daily      . cephALEXin (KEFLEX) 500 MG capsule Standing order for pt when she has UTI      . cyclobenzaprine (FLEXERIL) 5 MG tablet Pt has med prescribed but has not used any so far      . ESTRADIOL PO  Take by mouth.      . FIBER PO Take by mouth.      . Flaxseed, Linseed, (FLAX SEED OIL PO) Take by mouth. Take 1 tab daily      . Magnesium 100 MG CAPS Take by mouth.      . Multiple Vitamin (MULTIVITAMIN) tablet Take 1 tablet by mouth daily. Gummy Vitamin      . NON FORMULARY Compounded estrogen vaginal cream 2-3 times a week Dr Nori Riis      . vitamin E 1000 UNIT capsule Take 1,000 Units by mouth daily.       No current facility-administered medications for this visit.    No Known Allergies  History   Social History  . Marital Status: Married    Spouse Name: N/A    Number of Children: 3  . Years of Education: N/A   Occupational History  . Librarian Unemployed   Social History Main Topics  . Smoking status: Never Smoker   . Smokeless tobacco: Not on file  . Alcohol Use: No  . Drug Use: No  . Sexual Activity: Not on file   Other Topics Concern  . Not on file   Social History Narrative  . No  narrative on file    Family History  Problem Relation Age of Onset  . Cancer Father 56    prostate  . Cancer Paternal Aunt     breast  . Stroke Maternal Grandfather   . Hypertension Maternal Grandfather     Review of Systems:  As stated in the HPI and otherwise negative.   BP 146/80  Pulse 71  Ht 5\' 4"  (1.626 m)  Wt 156 lb (70.761 kg)  BMI 26.76 kg/m2  Physical Examination: General: Well developed, well nourished, NAD HEENT: OP clear, mucus membranes moist SKIN: warm, dry. No rashes. Neuro: No focal deficits Musculoskeletal: Muscle strength 5/5 all ext Psychiatric: Mood and affect normal Neck: No JVD, no carotid bruits, no thyromegaly, no lymphadenopathy. Lungs:Clear bilaterally, no wheezes, rhonci, crackles Cardiovascular: Regular rate and rhythm. No murmurs, gallops or rubs. Abdomen:Soft. Bowel sounds present. Non-tender.  Extremities: No lower extremity edema. Pulses are 2 + in the bilateral DP/PT.  Echo 02/07/12:  Left ventricle: The cavity size was normal.  Wall thickness was normal. Systolic function was normal. The estimated ejection fraction was in the range of 60% to 65%. Wall motion was normal; there were no regional wall motion abnormalities. Left ventricular diastolic function parameters were normal. - Aortic valve: Trivial regurgitation. - Atrial septum: No defect or patent foramen ovale was identified  EKG: NSR, rate 71 bpm.   Assessment and Plan:   1. Chest pain: No recurrence. Most likely non-cardiac. I will see her as needed. I think the estrogen patch should be ok to use with small CV risk.   2. Hyperlipidemia: She will fasting labs in primary care

## 2015-06-11 ENCOUNTER — Ambulatory Visit (INDEPENDENT_AMBULATORY_CARE_PROVIDER_SITE_OTHER): Payer: Managed Care, Other (non HMO) | Admitting: Family Medicine

## 2015-06-11 ENCOUNTER — Encounter: Payer: Self-pay | Admitting: Family Medicine

## 2015-06-11 VITALS — BP 140/90 | HR 90 | Temp 98.3°F | Wt 157.0 lb

## 2015-06-11 DIAGNOSIS — M2550 Pain in unspecified joint: Secondary | ICD-10-CM | POA: Diagnosis not present

## 2015-06-11 DIAGNOSIS — W57XXXA Bitten or stung by nonvenomous insect and other nonvenomous arthropods, initial encounter: Secondary | ICD-10-CM

## 2015-06-11 DIAGNOSIS — R5383 Other fatigue: Secondary | ICD-10-CM

## 2015-06-11 DIAGNOSIS — T148 Other injury of unspecified body region: Secondary | ICD-10-CM | POA: Diagnosis not present

## 2015-06-11 MED ORDER — DOXYCYCLINE HYCLATE 100 MG PO CAPS: 100.0000 mg | ORAL_CAPSULE | Freq: Two times a day (BID) | ORAL | Status: DC

## 2015-06-11 NOTE — Progress Notes (Signed)
   Subjective:    Patient ID: Debra Mcgrath, female    DOB: June 24, 1961, 54 y.o.   MRN: [id number]  HPI Patient seen with several recent tick bites. She had 3 or 4 ticks on her left side and also one on her left leg. This was about 3 weeks ago. She was recently up in [place] but thinks that these bites occurred prior to travel to this area. She's not had any fever or chills or headaches. She has had some fairly diffuse arthralgias involving hands, wrists, shoulders, knees. She has not seen objective evidence of inflammation such as erythema or warmth. Has not seen any rash.  She's also had some progressive fatigue, weight gain, and diffuse gradual hair loss over the past year. No cold intolerance. She is concerned about possible thyroid issues. No family history of thyroid disorder.  Past Medical History  Diagnosis Date  . Asthma     Childhood  . Headache(784.0)   . Migraine   . Colon polyps   . UTI (urinary tract infection)   . 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 Anxiety    Past Surgical History  Procedure Laterality Date  . Nasal sinus surgery  2007  . Abdominal hysterectomy    . Oophorectomy      reports that she has never smoked. She does not have any smokeless tobacco history on file. She reports that she does not drink alcohol or use illicit drugs. family history includes Cancer in her paternal aunt; Cancer (age of onset: 20) in her father; Hypertension in her maternal grandfather; Stroke in her maternal grandfather. No Known Allergies    Review of Systems  Constitutional: Positive for fatigue and unexpected weight change. Negative for fever, chills and appetite change.  Respiratory: Negative for cough.   Cardiovascular: Negative for chest pain.  Genitourinary: Negative for dysuria.  Musculoskeletal: Positive for arthralgias. Negative for neck stiffness.  Skin: Negative for rash.  Neurological: Negative for dizziness.  Hematological:  Negative for adenopathy.       Objective:   Physical Exam  Constitutional: She appears well-developed and well-nourished. No distress.  Neck: Neck supple. No thyromegaly present.  Cardiovascular: Normal rate and regular rhythm.   Pulmonary/Chest: Effort normal and breath sounds normal. No respiratory distress. She has no wheezes. She has no rales.  Musculoskeletal: She exhibits no edema.  Lymphadenopathy:    She has no cervical adenopathy.  Skin: No rash noted.          Assessment & Plan:  #1 recent tick bites. She does not have any rash to suggest erythema migrans. She does have new onset arthralgias which seemed to correlate with timing following tick bites and we explained likelihood of Lyme disease is fairly low in absence of fever or rash. We explained limitations of antibody testing.   She is complaining of diffuse arthralgias and will check other labs including sedimentation rate, CCP antibodies, rheumatoid factor, ANA. We decided to treat empirically with doxycycline 100 mg twice a day pending further labs.  Doubt acute inflammatory arthritis such as rheumatoid but she did have fairly sudden onset diffuse polyarthralgias #2 fatigue and weight gain. Check TSH

## 2015-06-11 NOTE — Progress Notes (Signed)
Pre visit review using our clinic review tool, if applicable. No additional management support is needed unless otherwise documented below in the visit note. 

## 2015-06-12 LAB — B. BURGDORFI ANTIBODIES: B burgdorferi Ab IgG+IgM: 0.29 {ISR}

## 2015-06-12 LAB — RHEUMATOID FACTOR: Rhuematoid fact SerPl-aCnc: <10 IU/mL (ref <=14)

## 2015-06-12 LAB — SEDIMENTATION RATE: SED RATE: 19 mm/h (ref 0–22)

## 2015-06-12 LAB — CYCLIC CITRUL PEPTIDE ANTIBODY, IGG: Cyclic Citrullin Peptide Ab: <2 U/mL (ref 0.0–5.0)

## 2015-06-12 LAB — ANA: Anti Nuclear Antibody(ANA): NEGATIVE

## 2015-06-12 LAB — TSH: TSH: 1.5 u[IU]/mL (ref 0.35–4.50)

## 2016-03-10 ENCOUNTER — Ambulatory Visit (INDEPENDENT_AMBULATORY_CARE_PROVIDER_SITE_OTHER): Payer: Managed Care, Other (non HMO) | Admitting: Family Medicine

## 2016-03-10 DIAGNOSIS — L259 Unspecified contact dermatitis, unspecified cause: Secondary | ICD-10-CM

## 2016-03-10 MED ORDER — METHYLPREDNISOLONE ACETATE 80 MG/ML IJ SUSP
80.0000 mg | Freq: Once | INTRAMUSCULAR | Status: AC
  Administered 2016-03-10: 80 mg via INTRAMUSCULAR

## 2016-03-10 MED ORDER — CEPHALEXIN 500 MG PO CAPS: 500.0000 mg | ORAL_CAPSULE | Freq: Three times a day (TID) | ORAL | Status: AC

## 2016-03-10 NOTE — Addendum Note (Signed)
Addended by: Elio Forget on: 03/10/2016 04:37 PM   Modules accepted: Orders

## 2016-03-10 NOTE — Progress Notes (Signed)
Pre visit review using our clinic review tool, if applicable. No additional management support is needed unless otherwise documented below in the visit note. 

## 2016-03-10 NOTE — Progress Notes (Signed)
   Subjective:    Patient ID: Debra Mcgrath, female    DOB: 07/13/1961, 55 y.o.   MRN: [id number]  HPI  Patient seen with pruritic rash.  Onset couple days ago.  First noted on her right wrist and subsequently left side of face, left chin, and neck region.  Moderate to severe pruritus.  History of contact dermatitis previously with similar rash.  Does recall working outside recently.  Has responded to intramuscular steroids and requesting the same  Past Medical History  Diagnosis Date  . Asthma     Childhood  . Headache(784.0)   . Migraine   . Colon polyps   . UTI (urinary tract infection)   . 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 Anxiety    Past Surgical History  Procedure Laterality Date  . Nasal sinus surgery  2007  . Abdominal hysterectomy    . Oophorectomy      reports that she has never smoked. She does not have any smokeless tobacco history on file. She reports that she does not drink alcohol or use illicit drugs. family history includes Cancer in her paternal aunt; Cancer (age of onset: 29) in her father; Hypertension in her maternal grandfather; Stroke in her maternal grandfather. No Known Allergies    Review of Systems  Constitutional: Negative for fever and chills.  Skin: Positive for rash.       Objective:   Physical Exam  Constitutional: She appears well-developed and well-nourished.  Cardiovascular: Normal rate and regular rhythm.   Pulmonary/Chest: Effort normal and breath sounds normal. No respiratory distress. She has no wheezes. She has no rales.  Skin: Rash noted.  Slightly raised vesicular and papular rash scattered dorsum right wrist, left chin, left neck, left face. Nontender.          Assessment & Plan:   Contact dermatitis. Widespread distribution. Discussed options for treatment. Patient prefers Depo-Medrol. Depo-Medrol 80 mg IM given. Follow-up as needed

## 2016-03-10 NOTE — Patient Instructions (Addendum)
Contact Dermatitis Dermatitis is redness, soreness, and swelling (inflammation) of the skin. Contact dermatitis is a reaction to certain substances that touch the skin. There are two types of contact dermatitis:   Irritant contact dermatitis. This type is caused by something that irritates your skin, such as dry hands from washing them too much. This type does not require previous exposure to the substance for a reaction to occur. This type is more common.  Allergic contact dermatitis. This type is caused by a substance that you are allergic to, such as a nickel allergy or poison ivy. This type only occurs if you have been exposed to the substance (allergen) before. Upon a repeat exposure, your body reacts to the substance. This type is less common. CAUSES  Many different substances can cause contact dermatitis. Irritant contact dermatitis is most commonly caused by exposure to:   Makeup.   Soaps.   Detergents.   Bleaches.   Acids.   Metal salts, such as nickel.  Allergic contact dermatitis is most commonly caused by exposure to:   Poisonous plants.   Chemicals.   Jewelry.   Latex.   Medicines.   Preservatives in products, such as clothing.  RISK FACTORS This condition is more likely to develop in:   People who have jobs that expose them to irritants or allergens.  People who have certain medical conditions, such as asthma or eczema.  SYMPTOMS  Symptoms of this condition may occur anywhere on your body where the irritant has touched you or is touched by you. Symptoms include:  Dryness or flaking.   Redness.   Cracks.   Itching.   Pain or a burning feeling.   Blisters.  Drainage of small amounts of blood or clear fluid from skin cracks. With allergic contact dermatitis, there may also be swelling in areas such as the eyelids, mouth, or genitals.  DIAGNOSIS  This condition is diagnosed with a medical history and physical exam. A patch skin test  may be performed to help determine the cause. If the condition is related to your job, you may need to see an occupational medicine specialist. TREATMENT Treatment for this condition includes figuring out what caused the reaction and protecting your skin from further contact. Treatment may also include:   Steroid creams or ointments. Oral steroid medicines may be needed in more severe cases.  Antibiotics or antibacterial ointments, if a skin infection is present.  Antihistamine lotion or an antihistamine taken by mouth to ease itching.  A bandage (dressing). HOME CARE INSTRUCTIONS Skin Care  Moisturize your skin as needed.   Apply cool compresses to the affected areas.  Try taking a bath with:  Epsom salts. Follow the instructions on the packaging. You can get these at your local pharmacy or grocery store.  Baking soda. Pour a small amount into the bath as directed by your health care provider.  Colloidal oatmeal. Follow the instructions on the packaging. You can get this at your local pharmacy or grocery store.  Try applying baking soda paste to your skin. Stir water into baking soda until it reaches a paste-like consistency.  Do not scratch your skin.  Bathe less frequently, such as every other day.  Bathe in lukewarm water. Avoid using hot water. Medicines  Take or apply over-the-counter and prescription medicines only as told by your health care provider.   If you were prescribed an antibiotic medicine, take or apply your antibiotic as told by your health care provider. Do not stop using the   antibiotic even if your condition starts to improve. General Instructions  Keep all follow-up visits as told by your health care provider. This is important.  Avoid the substance that caused your reaction. If you do not know what caused it, keep a journal to try to track what caused it. Write down:  What you eat.  What cosmetic products you use.  What you drink.  What  you wear in the affected area. This includes jewelry.  If you were given a dressing, take care of it as told by your health care provider. This includes when to change and remove it. SEEK MEDICAL CARE IF:   Your condition does not improve with treatment.  Your condition gets worse.  You have signs of infection such as swelling, tenderness, redness, soreness, or warmth in the affected area.  You have a fever.  You have new symptoms. SEEK IMMEDIATE MEDICAL CARE IF:   You have a severe headache, neck pain, or neck stiffness.  You vomit.  You feel very sleepy.  You notice red streaks coming from the affected area.  Your bone or joint underneath the affected area becomes painful after the skin has healed.  The affected area turns darker.  You have difficulty breathing.   This information is not intended to replace advice given to you by your health care provider. Make sure you discuss any questions you have with your health care provider.   Document Released: 11/12/2000 Document Revised: 08/06/2015 Document Reviewed: 04/02/2015 Elsevier Interactive Patient Education 2016 [place] DASH stands for "Dietary Approaches to Stop Hypertension." The DASH eating plan is a healthy eating plan that has been shown to reduce high blood pressure (hypertension). Additional health benefits may include reducing the risk of type 2 diabetes mellitus, heart disease, and stroke. The DASH eating plan may also help with weight loss. WHAT DO I NEED TO KNOW ABOUT THE DASH EATING PLAN? For the DASH eating plan, you will follow these general guidelines:  Choose foods with a percent daily value for sodium of less than 5% (as listed on the food label).  Use salt-free seasonings or herbs instead of table salt or sea salt.  Check with your health care provider or pharmacist before using salt substitutes.  Eat lower-sodium products, often labeled as "lower sodium" or "no salt  added."  Eat fresh foods.  Eat more vegetables, fruits, and low-fat dairy products.  Choose whole grains. Look for the word "whole" as the first word in the ingredient list.  Choose fish and skinless chicken or [place] more often than red meat. Limit fish, poultry, and meat to 6 oz (170 g) each day.  Limit sweets, desserts, sugars, and sugary drinks.  Choose heart-healthy fats.  Limit cheese to 1 oz (28 g) per day.  Eat more home-cooked food and less restaurant, buffet, and fast food.  Limit fried foods.  Cook foods using methods other than frying.  Limit canned vegetables. If you do use them, rinse them well to decrease the sodium.  When eating at a restaurant, ask that your food be prepared with less salt, or no salt if possible. WHAT FOODS CAN I EAT? Seek help from a dietitian for individual calorie needs. Grains Whole grain or whole wheat bread. Brown rice. Whole grain or whole wheat pasta. Quinoa, bulgur, and whole grain cereals. Low-sodium cereals. Corn or whole wheat flour tortillas. Whole grain cornbread. Whole grain crackers. Low-sodium crackers. Vegetables Fresh or frozen vegetables (raw, steamed, roasted, or grilled). Low-sodium  or reduced-sodium tomato and vegetable juices. Low-sodium or reduced-sodium tomato sauce and paste. Low-sodium or reduced-sodium canned vegetables.  Fruits All fresh, canned (in natural juice), or frozen fruits. Meat and Other Protein Products Ground beef (85% or leaner), grass-fed beef, or beef trimmed of fat. Skinless chicken or [place]. Ground chicken or [place]. Pork trimmed of fat. All fish and seafood. Eggs. Dried beans, peas, or lentils. Unsalted nuts and seeds. Unsalted canned beans. Dairy Low-fat dairy products, such as skim or 1% milk, 2% or reduced-fat cheeses, low-fat ricotta or cottage cheese, or plain low-fat yogurt. Low-sodium or reduced-sodium cheeses. Fats and Oils Tub margarines without trans fats. Light or reduced-fat  mayonnaise and salad dressings (reduced sodium). Avocado. Safflower, olive, or canola oils. Natural peanut or almond butter. Other Unsalted popcorn and pretzels. The items listed above may not be a complete list of recommended foods or beverages. Contact your dietitian for more options. WHAT FOODS ARE NOT RECOMMENDED? Grains White bread. White pasta. White rice. Refined cornbread. Bagels and croissants. Crackers that contain trans fat. Vegetables Creamed or fried vegetables. Vegetables in a cheese sauce. Regular canned vegetables. Regular canned tomato sauce and paste. Regular tomato and vegetable juices. Fruits Dried fruits. Canned fruit in light or heavy syrup. Fruit juice. Meat and Other Protein Products Fatty cuts of meat. Ribs, chicken wings, bacon, sausage, bologna, salami, chitterlings, fatback, hot dogs, bratwurst, and packaged luncheon meats. Salted nuts and seeds. Canned beans with salt. Dairy Whole or 2% milk, cream, half-and-half, and cream cheese. Whole-fat or sweetened yogurt. Full-fat cheeses or blue cheese. Nondairy creamers and whipped toppings. Processed cheese, cheese spreads, or cheese curds. Condiments Onion and garlic salt, seasoned salt, table salt, and sea salt. Canned and packaged gravies. Worcestershire sauce. Tartar sauce. Barbecue sauce. Teriyaki sauce. Soy sauce, including reduced sodium. Steak sauce. Fish sauce. Oyster sauce. Cocktail sauce. Horseradish. Ketchup and mustard. Meat flavorings and tenderizers. Bouillon cubes. Hot sauce. Tabasco sauce. Marinades. Taco seasonings. Relishes. Fats and Oils Butter, stick margarine, lard, shortening, ghee, and bacon fat. Coconut, palm kernel, or palm oils. Regular salad dressings. Other Pickles and olives. Salted popcorn and pretzels. The items listed above may not be a complete list of foods and beverages to avoid. Contact your dietitian for more information. WHERE CAN I FIND MORE INFORMATION? National Heart, Lung, and  Blood Institute: travelstabloid.com   This information is not intended to replace advice given to you by your health care provider. Make sure you discuss any questions you have with your health care provider.   Document Released: 11/04/2011 Document Revised: 12/06/2014 Document Reviewed: 09/19/2013 Elsevier Interactive Patient Education [employer].

## 2016-03-15 ENCOUNTER — Telehealth: Payer: Self-pay | Admitting: Family Medicine

## 2016-03-15 MED ORDER — PREDNISONE 10 MG PO TABS: ORAL_TABLET | ORAL | Status: AC

## 2016-03-15 NOTE — Telephone Encounter (Signed)
Pt was DX 03/10/2016. She did receive a depo shot in the office. Please advise.

## 2016-03-15 NOTE — Telephone Encounter (Signed)
Lets do prednisone taper:  Prednisone 10 mg taper as follows: 4-4-4-3-3-2-2-1-1 #24

## 2016-03-15 NOTE — Telephone Encounter (Signed)
Pt call to say her poison ivy is not improving that it has spreaded every where and was told to call back if not improving   Pharmacy; [facility]

## 2016-03-15 NOTE — Telephone Encounter (Signed)
Pt is aware via voicemail that medication has been sent into the pharmacy.

## 2020-09-30 ENCOUNTER — Ambulatory Visit (INDEPENDENT_AMBULATORY_CARE_PROVIDER_SITE_OTHER): Payer: BC Managed Care – PPO

## 2020-09-30 ENCOUNTER — Encounter: Payer: Self-pay | Admitting: Podiatry

## 2020-09-30 ENCOUNTER — Ambulatory Visit (INDEPENDENT_AMBULATORY_CARE_PROVIDER_SITE_OTHER): Payer: BC Managed Care – PPO | Admitting: Podiatry

## 2020-09-30 ENCOUNTER — Other Ambulatory Visit: Payer: Self-pay

## 2020-09-30 DIAGNOSIS — M79672 Pain in left foot: Secondary | ICD-10-CM | POA: Diagnosis not present

## 2020-09-30 DIAGNOSIS — S9032XA Contusion of left foot, initial encounter: Secondary | ICD-10-CM

## 2020-09-30 DIAGNOSIS — M7752 Other enthesopathy of left foot: Secondary | ICD-10-CM

## 2020-09-30 DIAGNOSIS — R600 Localized edema: Secondary | ICD-10-CM

## 2020-09-30 NOTE — Progress Notes (Signed)
  Subjective:  Patient ID: Debra Mcgrath, female    DOB: [birth year],  MRN: [id number]  Chief Complaint  Patient presents with  . Foot Injury    Pt states hit left foot 5 days ago, split left 4th/5th toes and has plantar forefoot/lateral foot pain. Pt states it has improved slowly.    59 y.o. female presents with the above complaint. History confirmed with patient.   Objective:  Physical Exam: warm, good capillary refill, no trophic changes or ulcerative lesions, normal DP and PT pulses and normal sensory exam. Left Foot: POP 5th metatarsal, 4th metatarsal, intermetatarsal area. Bruising contusion.  No images are attached to the encounter.  Radiographs: X-ray of the left foot: no fracture, dislocation, swelling or degenerative changes noted Assessment:   1. Contusion of left foot, initial encounter   2. Pain in left foot   3. Localized edema   4. Capsulitis of metatarsophalangeal (MTP) joint of left foot    Plan:  Patient was evaluated and treated and all questions answered.  Contusion left foot -Improving per patient -XR taken without evidence of fracture or dislocation -Should resolve with time  No follow-ups on file.

## 2023-06-24 LAB — COLOGUARD: COLOGUARD: NEGATIVE
